# Patient Record
Sex: Female | Born: 1970 | Race: Black or African American | Hispanic: No | Marital: Married | Smoking: Never smoker
Health system: Southern US, Community
[De-identification: ages and names within clinical notes are randomized; demographics above are authoritative.]

## PROBLEM LIST (undated history)

## (undated) DIAGNOSIS — E119 Type 2 diabetes mellitus without complications: Secondary | ICD-10-CM

## (undated) DIAGNOSIS — E7800 Pure hypercholesterolemia, unspecified: Secondary | ICD-10-CM

## (undated) DIAGNOSIS — I10 Essential (primary) hypertension: Secondary | ICD-10-CM

## (undated) HISTORY — PX: BUNIONECTOMY: SHX129

## (undated) HISTORY — PX: THYROIDECTOMY: SHX17

## (undated) HISTORY — PX: TUBAL LIGATION: SHX77

---

## 2000-03-15 ENCOUNTER — Other Ambulatory Visit: Admission: RE | Admit: 2000-03-15 | Discharge: 2000-03-15 | Payer: Self-pay | Admitting: Obstetrics and Gynecology

## 2000-05-07 ENCOUNTER — Encounter: Admission: RE | Admit: 2000-05-07 | Discharge: 2000-08-05 | Payer: Self-pay | Admitting: Obstetrics and Gynecology

## 2000-07-30 ENCOUNTER — Ambulatory Visit (HOSPITAL_COMMUNITY): Admission: RE | Admit: 2000-07-30 | Discharge: 2000-07-30 | Payer: Self-pay | Admitting: Obstetrics and Gynecology

## 2000-07-30 ENCOUNTER — Encounter: Payer: Self-pay | Admitting: Obstetrics and Gynecology

## 2000-08-10 ENCOUNTER — Inpatient Hospital Stay (HOSPITAL_COMMUNITY): Admission: AD | Admit: 2000-08-10 | Discharge: 2000-08-12 | Payer: Self-pay | Admitting: Obstetrics and Gynecology

## 2001-08-02 ENCOUNTER — Other Ambulatory Visit: Admission: RE | Admit: 2001-08-02 | Discharge: 2001-08-02 | Payer: Self-pay | Admitting: Obstetrics and Gynecology

## 2004-01-03 ENCOUNTER — Other Ambulatory Visit: Admission: RE | Admit: 2004-01-03 | Discharge: 2004-01-03 | Payer: Self-pay | Admitting: Obstetrics and Gynecology

## 2005-01-20 ENCOUNTER — Other Ambulatory Visit: Admission: RE | Admit: 2005-01-20 | Discharge: 2005-01-20 | Payer: Self-pay | Admitting: Obstetrics and Gynecology

## 2012-02-02 ENCOUNTER — Ambulatory Visit: Payer: Self-pay

## 2012-06-28 ENCOUNTER — Other Ambulatory Visit: Payer: Self-pay | Admitting: Obstetrics and Gynecology

## 2012-06-28 DIAGNOSIS — R928 Other abnormal and inconclusive findings on diagnostic imaging of breast: Secondary | ICD-10-CM

## 2012-07-08 ENCOUNTER — Ambulatory Visit
Admission: RE | Admit: 2012-07-08 | Discharge: 2012-07-08 | Disposition: A | Discharge status: Home / Self Care | Payer: BC Managed Care – PPO | Source: Ambulatory Visit | Attending: Obstetrics and Gynecology | Admitting: Obstetrics and Gynecology

## 2012-07-08 ENCOUNTER — Other Ambulatory Visit: Payer: Self-pay | Admitting: Obstetrics and Gynecology

## 2012-07-08 DIAGNOSIS — R928 Other abnormal and inconclusive findings on diagnostic imaging of breast: Secondary | ICD-10-CM

## 2012-07-08 DIAGNOSIS — R921 Mammographic calcification found on diagnostic imaging of breast: Secondary | ICD-10-CM

## 2012-07-12 ENCOUNTER — Other Ambulatory Visit (HOSPITAL_COMMUNITY): Payer: Self-pay | Admitting: Diagnostic Radiology

## 2012-07-12 ENCOUNTER — Ambulatory Visit
Admission: RE | Admit: 2012-07-12 | Discharge: 2012-07-12 | Disposition: A | Discharge status: Home / Self Care | Payer: BC Managed Care – PPO | Source: Ambulatory Visit | Attending: Obstetrics and Gynecology | Admitting: Obstetrics and Gynecology

## 2012-07-12 DIAGNOSIS — R921 Mammographic calcification found on diagnostic imaging of breast: Secondary | ICD-10-CM

## 2013-07-06 ENCOUNTER — Other Ambulatory Visit: Payer: Self-pay | Admitting: Obstetrics and Gynecology

## 2015-04-30 DIAGNOSIS — E890 Postprocedural hypothyroidism: Secondary | ICD-10-CM | POA: Insufficient documentation

## 2015-04-30 DIAGNOSIS — E1165 Type 2 diabetes mellitus with hyperglycemia: Secondary | ICD-10-CM | POA: Insufficient documentation

## 2015-04-30 DIAGNOSIS — I10 Essential (primary) hypertension: Secondary | ICD-10-CM | POA: Insufficient documentation

## 2015-04-30 DIAGNOSIS — E669 Obesity, unspecified: Secondary | ICD-10-CM | POA: Insufficient documentation

## 2015-04-30 DIAGNOSIS — E892 Postprocedural hypoparathyroidism: Secondary | ICD-10-CM | POA: Insufficient documentation

## 2015-04-30 DIAGNOSIS — E559 Vitamin D deficiency, unspecified: Secondary | ICD-10-CM | POA: Insufficient documentation

## 2017-03-25 ENCOUNTER — Encounter: Payer: Self-pay | Admitting: Podiatry

## 2017-04-27 NOTE — Progress Notes (Signed)
This encounter was created in error - please disregard.

## 2017-11-18 DIAGNOSIS — E785 Hyperlipidemia, unspecified: Secondary | ICD-10-CM | POA: Insufficient documentation

## 2018-02-24 ENCOUNTER — Other Ambulatory Visit: Payer: Self-pay | Admitting: Chiropractic Medicine

## 2018-02-24 DIAGNOSIS — M545 Low back pain, unspecified: Secondary | ICD-10-CM

## 2018-03-06 ENCOUNTER — Ambulatory Visit
Admission: RE | Admit: 2018-03-06 | Discharge: 2018-03-06 | Disposition: A | Discharge status: Home / Self Care | Payer: 59 | Source: Ambulatory Visit | Attending: Chiropractic Medicine | Admitting: Chiropractic Medicine

## 2018-03-06 DIAGNOSIS — M545 Low back pain, unspecified: Secondary | ICD-10-CM

## 2019-01-31 ENCOUNTER — Other Ambulatory Visit: Payer: Self-pay

## 2019-01-31 DIAGNOSIS — Z20822 Contact with and (suspected) exposure to covid-19: Secondary | ICD-10-CM

## 2019-02-01 LAB — NOVEL CORONAVIRUS, NAA: SARS-CoV-2, NAA: NOT DETECTED

## 2019-05-12 ENCOUNTER — Ambulatory Visit (INDEPENDENT_AMBULATORY_CARE_PROVIDER_SITE_OTHER): Payer: 59 | Admitting: Podiatry

## 2019-05-12 DIAGNOSIS — Z5329 Procedure and treatment not carried out because of patient's decision for other reasons: Secondary | ICD-10-CM

## 2019-05-12 NOTE — Progress Notes (Signed)
No show for appt. 

## 2019-08-18 ENCOUNTER — Other Ambulatory Visit: Payer: Self-pay | Admitting: Podiatry

## 2019-08-18 ENCOUNTER — Ambulatory Visit: Payer: 59 | Admitting: Podiatry

## 2019-08-18 ENCOUNTER — Other Ambulatory Visit: Payer: Self-pay

## 2019-08-18 ENCOUNTER — Ambulatory Visit (INDEPENDENT_AMBULATORY_CARE_PROVIDER_SITE_OTHER): Payer: 59

## 2019-08-18 DIAGNOSIS — M21611 Bunion of right foot: Secondary | ICD-10-CM

## 2019-08-18 DIAGNOSIS — E669 Obesity, unspecified: Secondary | ICD-10-CM

## 2019-08-18 DIAGNOSIS — M2011 Hallux valgus (acquired), right foot: Secondary | ICD-10-CM | POA: Diagnosis not present

## 2019-08-18 DIAGNOSIS — M2012 Hallux valgus (acquired), left foot: Secondary | ICD-10-CM

## 2019-08-18 DIAGNOSIS — N390 Urinary tract infection, site not specified: Secondary | ICD-10-CM | POA: Insufficient documentation

## 2019-08-18 DIAGNOSIS — N898 Other specified noninflammatory disorders of vagina: Secondary | ICD-10-CM | POA: Insufficient documentation

## 2019-08-18 DIAGNOSIS — E785 Hyperlipidemia, unspecified: Secondary | ICD-10-CM

## 2019-08-18 DIAGNOSIS — I10 Essential (primary) hypertension: Secondary | ICD-10-CM

## 2019-08-18 DIAGNOSIS — M21612 Bunion of left foot: Secondary | ICD-10-CM

## 2019-08-18 NOTE — Patient Instructions (Signed)
Pre-Operative Instructions  Congratulations, you have decided to take an important step towards improving your quality of life.  You can be assured that the doctors and staff at [facility] will be with you every step of the way.  Here are some important things you should know:  1. Plan to be at the surgery center/hospital at least 1 (one) hour prior to your scheduled time, unless otherwise directed by the surgical center/hospital staff.  You must have a responsible adult accompany you, remain during the surgery and drive you home.  Make sure you have directions to the surgical center/hospital to ensure you arrive on time. 2. If you are having surgery at [facility] or [facility], you will need a copy of your medical history and physical form from your family physician within one month prior to the date of surgery. We will give you a form for your primary physician to complete.  3. We make every effort to accommodate the date you request for surgery.  However, there are times where surgery dates or times have to be moved.  We will contact you as soon as possible if a change in schedule is required.   4. No aspirin/ibuprofen for one week before surgery.  If you are on aspirin, any non-steroidal anti-inflammatory medications (Mobic, Aleve, Ibuprofen) should not be taken seven (7) days prior to your surgery.  You make take Tylenol for pain prior to surgery.  5. Medications - If you are taking daily heart and blood pressure medications, seizure, reflux, allergy, asthma, anxiety, pain or diabetes medications, make sure you notify the surgery center/hospital before the day of surgery so they can tell you which medications you should take or avoid the day of surgery. 6. No food or drink after midnight the night before surgery unless directed otherwise by surgical center/hospital staff. 7. No alcoholic beverages 24-hours prior to surgery.  No smoking 24-hours prior or 24-hours after  surgery. 8. Wear loose pants or shorts. They should be loose enough to fit over bandages, boots, and casts. 9. Don't wear slip-on shoes. Sneakers are preferred. 10. Bring your boot with you to the surgery center/hospital.  Also bring crutches or a walker if your physician has prescribed it for you.  If you do not have this equipment, it will be provided for you after surgery. 11. If you have not been contacted by the surgery center/hospital by the day before your surgery, call to confirm the date and time of your surgery. 12. Leave-time from work may vary depending on the type of surgery you have.  Appropriate arrangements should be made prior to surgery with your employer. 13. Prescriptions will be provided immediately following surgery by your doctor.  Fill these as soon as possible after surgery and take the medication as directed. Pain medications will not be refilled on weekends and must be approved by the doctor. 14. Remove nail polish on the operative foot and avoid getting pedicures prior to surgery. 15. Wash the night before surgery.  The night before surgery wash the foot and leg well with water and the antibacterial soap provided. Be sure to pay special attention to beneath the toenails and in between the toes.  Wash for at least three (3) minutes. Rinse thoroughly with water and dry well with a towel.  Perform this wash unless told not to do so by your physician.  Enclosed: 1 Ice pack (please put in freezer the night before surgery)   1 Hibiclens skin cleaner     Pre-op instructions  If you have any questions regarding the instructions, please do not hesitate to call our office.  [place]: [address], [place], [place] [address] -- [phone]  [place]: [address] [place], [place] [address] -- [phone]  [place]: [address], , [place] [address] -- [phone]   Website: [email or web address] 

## 2019-08-27 NOTE — Progress Notes (Signed)
  Subjective:  Patient ID: Cindy Farrell, female    DOB: 10/19/1970,  MRN: [id number]  Chief Complaint  Patient presents with  . Bunions    Pt states bilateral bunions which are occasionally painful.  . Foot Problem    Right 1st digit small painless bump 2 week duration no known injuries.    49 y.o. female presents with the above complaint.   Review of Systems: Negative except as noted in the HPI. Denies N/V/F/Ch.  No past medical history on file.  Current Outpatient Medications:  .  atorvastatin (LIPITOR) 40 MG tablet, Take 40 mg by mouth daily., Disp: , Rfl:  .  calcitRIOL (ROCALTROL) 0.25 MCG capsule, , Disp: , Rfl:  .  ergocalciferol (VITAMIN D2) 1.25 MG (50000 UT) capsule, ergocalciferol (vitamin D2) 1,250 mcg (50,000 unit) capsule, Disp: , Rfl:  .  JANUMET 50-1000 MG tablet, , Disp: , Rfl:  .  levothyroxine (SYNTHROID, LEVOTHROID) 125 MCG tablet, Take 125 mcg by mouth daily., Disp: , Rfl: 3 .  lisinopril-hydrochlorothiazide (PRINZIDE,ZESTORETIC) 10-12.5 MG tablet, , Disp: , Rfl:  .  metFORMIN (GLUCOPHAGE-XR) 750 MG 24 hr tablet, metformin ER 750 mg tablet,extended release 24 hr, Disp: , Rfl:  .  XIGDUO XR 06-998 MG TB24, Take 1 tablet by mouth 2 (two) times daily., Disp: , Rfl:   Social History   Tobacco Use  Smoking Status Not on file    Allergies  Allergen Reactions  . No Known Allergies    Objective:  Physical Exam: warm, good capillary refill, no trophic changes or ulcerative lesions, normal DP and PT pulses and normal sensory exam. Left Foot: hallux valgus with POP medial eminence Right Foot: hallux valgus with POP medial eminence. Hallux IPJ palpable cyst  No images are attached to the encounter.  Radiographs: Taken and reviewed. Hallux abductovalgus deformity present bilateral without acute fracture.  Assessment:   1. Hallux valgus with bunions, left   2. Hallux valgus with bunions, right   3. Hyperlipidemia, unspecified hyperlipidemia type   4.  Obesity (BMI 30-39.9)   5. Essential hypertension    Plan:  Patient was evaluated and treated and all questions answered.  Hallux abductovalgus deformity,  -XR as above. -Patient has failed all conservative therapy and wishes to proceed with surgical intervention. All risks, benefits, and alternatives discussed with patient. No guarantees given. Consent reviewed and signed by patient. Post-op course explained at length. -Planned procedures: Correction right foot bunion, excision of ganglion cyst 1st toe -Risk factors: HTN, HLD, Obesity.  Return for after surgery.

## 2019-10-05 DIAGNOSIS — M79676 Pain in unspecified toe(s): Secondary | ICD-10-CM

## 2019-10-17 ENCOUNTER — Telehealth: Payer: Self-pay | Admitting: Podiatry

## 2019-10-17 NOTE — Telephone Encounter (Signed)
DOS: 10/18/2019  [place] Bunionectomy Right [id number]) Exc. Ganglion Toe Hallux Right ([id number])  [employer] Effective 01.01.2020 -  Deductible: $1,000 with $183.95 met and $816.05 remains. Out of Pocket: $4,500 with [phone] met and $3,831.05 remains. CoInsurance: 20%   No prior authorization is required per Leveda Anna B. Call Ref# [id number].

## 2019-10-18 ENCOUNTER — Encounter: Payer: Self-pay | Admitting: Podiatry

## 2019-10-18 ENCOUNTER — Other Ambulatory Visit: Payer: Self-pay | Admitting: Podiatry

## 2019-10-18 DIAGNOSIS — M7661 Achilles tendinitis, right leg: Secondary | ICD-10-CM

## 2019-10-18 DIAGNOSIS — M2011 Hallux valgus (acquired), right foot: Secondary | ICD-10-CM

## 2019-10-18 MED ORDER — CEPHALEXIN 500 MG PO CAPS
500.0000 mg | ORAL_CAPSULE | Freq: Two times a day (BID) | ORAL | 0 refills | Status: DC
Start: 2019-10-18 — End: 2021-11-21

## 2019-10-18 MED ORDER — ONDANSETRON HCL 4 MG PO TABS: 4.0000 mg | ORAL_TABLET | Freq: Three times a day (TID) | ORAL | 0 refills | Status: DC | PRN

## 2019-10-18 MED ORDER — OXYCODONE-ACETAMINOPHEN 10-325 MG PO TABS: 1.0000 | ORAL_TABLET | ORAL | 0 refills | Status: DC | PRN

## 2019-10-18 MED ORDER — CEPHALEXIN 500 MG PO CAPS
500.0000 mg | ORAL_CAPSULE | Freq: Two times a day (BID) | ORAL | 0 refills | Status: DC
Start: 2019-10-18 — End: 2019-10-18

## 2019-10-18 NOTE — Addendum Note (Signed)
Addended by: Ventura Sellers on: 10/18/2019 02:18 PM   Modules accepted: Orders

## 2019-10-18 NOTE — Progress Notes (Signed)
Rx sent to pharmacy for outpatient surgery. °

## 2019-10-24 ENCOUNTER — Ambulatory Visit (INDEPENDENT_AMBULATORY_CARE_PROVIDER_SITE_OTHER): Payer: Managed Care, Other (non HMO)

## 2019-10-24 ENCOUNTER — Ambulatory Visit (INDEPENDENT_AMBULATORY_CARE_PROVIDER_SITE_OTHER): Payer: Managed Care, Other (non HMO) | Admitting: Podiatry

## 2019-10-24 ENCOUNTER — Other Ambulatory Visit: Payer: Self-pay

## 2019-10-24 DIAGNOSIS — M2011 Hallux valgus (acquired), right foot: Secondary | ICD-10-CM | POA: Diagnosis not present

## 2019-10-24 DIAGNOSIS — M21611 Bunion of right foot: Secondary | ICD-10-CM

## 2019-10-24 MED ORDER — HYDROCODONE-ACETAMINOPHEN 5-325 MG PO TABS: 1.0000 | ORAL_TABLET | Freq: Four times a day (QID) | ORAL | 0 refills | Status: DC | PRN

## 2019-10-24 NOTE — Progress Notes (Signed)
  Subjective:  Patient ID: Cindy Farrell, female    DOB: 08-01-1970,  MRN: [id number]  Chief Complaint  Patient presents with  . Routine Post Op    POV#1 DOS 8.18.2021 [place] BUNIONECTOMY, EXC GANGLION TOE RT. Pt states healing well, denies fever/nausea/vomiting/chills. Pt states some irritation in the right posterior heel from bandages being too tight.    DOS: 10/18/19 Procedure: [place] Bunionectomy left, excision of ganglion  49 y.o. female presents with the above complaint. History confirmed with patient.   Objective:  Physical Exam: tenderness at the surgical site, local edema noted, calf supple, nontender and reduced HAV deformity. Incision: healing well, no significant drainage, no dehiscence, no significant erythema  No images are attached to the encounter.  Radiographs: X-ray of the right foot: consistent with postop state with intact and stable fixation reduced IM deformity.   Assessment:   1. Hallux valgus with bunions, right     Plan:  Patient was evaluated and treated and all questions answered.  Post-operative State -XR reviewed with patient -Dressing applied consisting of sterile gauze, kerlix and ACE bandage -WBAT in CAM boot -Pain medication refilled  No follow-ups on file.

## 2019-11-03 ENCOUNTER — Encounter: Payer: 59 | Admitting: Podiatry

## 2019-11-03 ENCOUNTER — Ambulatory Visit (INDEPENDENT_AMBULATORY_CARE_PROVIDER_SITE_OTHER): Payer: Managed Care, Other (non HMO) | Admitting: Podiatry

## 2019-11-03 ENCOUNTER — Other Ambulatory Visit: Payer: Self-pay

## 2019-11-03 DIAGNOSIS — M2011 Hallux valgus (acquired), right foot: Secondary | ICD-10-CM

## 2019-11-03 DIAGNOSIS — M21611 Bunion of right foot: Secondary | ICD-10-CM

## 2019-11-03 NOTE — Progress Notes (Signed)
  Subjective:  Patient ID: Cindy Farrell, female    DOB: 11-Feb-1971,  MRN: [id number]  Chief Complaint  Patient presents with  . Routine Post Op    POV#2 Pt states," the pain is not like it was, I can feel where the pins are when I try to stretchi it out; 5/10 ." - pt states her big toe has a weird feeling -pt states she had to re-dressed bandage twice due to itching Tx: boot, hydro, elevation and vaseline - no N/V/F/Ch     DOS: 10/18/19 Procedure: [place] Bunionectomy left, excision of ganglion  49 y.o. female presents with the above complaint. History confirmed with patient.   Objective:  Physical Exam: tenderness at the surgical site, local edema noted, calf supple, nontender and reduced HAV deformity. Incision: healing well, no significant drainage, no dehiscence, no significant erythema  Assessment:   1. Hallux valgus with bunions, right     Plan:  Patient was evaluated and treated and all questions answered.  Post-operative State -Suture ends cut. Cover with ACE bandage -Ok to start showering at this time. Advised they cannot soak. -WBAT in CAM boot -Educated on ROM exercises. -F/u in 2 weeks for repeat XRs  No follow-ups on file.

## 2019-11-17 ENCOUNTER — Ambulatory Visit (INDEPENDENT_AMBULATORY_CARE_PROVIDER_SITE_OTHER): Payer: Managed Care, Other (non HMO)

## 2019-11-17 ENCOUNTER — Ambulatory Visit (INDEPENDENT_AMBULATORY_CARE_PROVIDER_SITE_OTHER): Payer: Managed Care, Other (non HMO) | Admitting: Podiatry

## 2019-11-17 ENCOUNTER — Other Ambulatory Visit: Payer: Self-pay

## 2019-11-17 DIAGNOSIS — Z9119 Patient's noncompliance with other medical treatment and regimen: Secondary | ICD-10-CM

## 2019-11-17 DIAGNOSIS — M2011 Hallux valgus (acquired), right foot: Secondary | ICD-10-CM

## 2019-11-17 DIAGNOSIS — S92311P Displaced fracture of first metatarsal bone, right foot, subsequent encounter for fracture with malunion: Secondary | ICD-10-CM

## 2019-11-17 DIAGNOSIS — M21611 Bunion of right foot: Secondary | ICD-10-CM

## 2019-11-17 DIAGNOSIS — Z91199 Patient's noncompliance with other medical treatment and regimen due to unspecified reason: Secondary | ICD-10-CM

## 2019-11-17 NOTE — Progress Notes (Signed)
  Subjective:  Patient ID: Cindy Farrell, female    DOB: 1970-04-08,  MRN: [id number]  Chief Complaint  Patient presents with  . Routine Post Op    POV #3 DOS 10/18/19 [place] BUNIONECTOMY, EXC GNGLION TOE RT    DOS: 10/18/19 Procedure: [place] Bunionectomy left, excision of ganglion  49 y.o. female presents with the above complaint. History confirmed with patient. States she has walked around the house without her boot on and other small trips including to the mailbox. States the incision oozed recently and she was concerned.  Objective:  Physical Exam: tenderness at the surgical site, local edema noted, calf supple, nontender and reduced HAV deformity. Incision: slight area of decreased skin approximation but without active drainage. No warmth, erythema, sign of infection.  Assessment:   1. Hallux valgus with bunions, right   2. Closed displaced fracture of first metatarsal bone of right foot with malunion, subsequent encounter   3. Non-compliance with treatment     Plan:  Patient was evaluated and treated and all questions answered.  Post-operative State -XR reviewed with patient. New angulation of the metatarsal head. -Discussed with patient that her non-compliance with WB was likely the reason for both delayed wound healing and rotation of the capital fragment. Discussed the importance of restoring joint congruity for preventing arthritis. -Patient has failed all conservative therapy and wishes to proceed with surgical intervention. All risks, benefits, and alternatives discussed with patient. No guarantees given. Consent reviewed and signed by patient. -Planned procedures: Reduction and pinning of metatarsal fracture vs open reduction of fracture.   No follow-ups on file.

## 2019-11-20 ENCOUNTER — Telehealth: Payer: Self-pay

## 2019-11-20 NOTE — Telephone Encounter (Signed)
DOS 11/23/2019  OPEN TREATMENT METATARSAL FX RT - 50037 PERCUTANEOUS SKELETAL FIXATION OF MET RT - 04888   [employer] Effective 01.01.2020 -  Deductible: $1,000 with $183.95 met and $816.05 remains. Out of Pocket: $4,500 with [phone] met and $3,831.05 remains. CoInsurance: 20%  PER AUTOMATED SYSTEM NO PRECERT REQUIRED FOR THE FOLLOWING CPT CODES.  [place] CONF # [phone] - CONF # [id number]

## 2019-11-23 ENCOUNTER — Encounter: Payer: Self-pay | Admitting: Podiatry

## 2019-11-23 ENCOUNTER — Other Ambulatory Visit: Payer: Self-pay | Admitting: Podiatry

## 2019-11-23 DIAGNOSIS — S92311P Displaced fracture of first metatarsal bone, right foot, subsequent encounter for fracture with malunion: Secondary | ICD-10-CM

## 2019-11-23 MED ORDER — CEPHALEXIN 500 MG PO CAPS: 500.0000 mg | ORAL_CAPSULE | Freq: Two times a day (BID) | ORAL | 0 refills | Status: DC

## 2019-11-23 MED ORDER — OXYCODONE-ACETAMINOPHEN 10-325 MG PO TABS: 1.0000 | ORAL_TABLET | ORAL | 0 refills | Status: DC | PRN

## 2019-11-23 NOTE — Progress Notes (Signed)
Rx sent to pharmacy for outpatient surgery.  [facility]

## 2019-11-24 ENCOUNTER — Telehealth: Payer: Self-pay | Admitting: Podiatry

## 2019-11-24 NOTE — Telephone Encounter (Signed)
Pt has requested a knee scooter.

## 2019-11-28 ENCOUNTER — Other Ambulatory Visit: Payer: Self-pay

## 2019-11-28 ENCOUNTER — Ambulatory Visit (INDEPENDENT_AMBULATORY_CARE_PROVIDER_SITE_OTHER): Payer: Managed Care, Other (non HMO) | Admitting: Podiatry

## 2019-11-28 ENCOUNTER — Ambulatory Visit (INDEPENDENT_AMBULATORY_CARE_PROVIDER_SITE_OTHER): Payer: Managed Care, Other (non HMO)

## 2019-11-28 DIAGNOSIS — M2011 Hallux valgus (acquired), right foot: Secondary | ICD-10-CM

## 2019-11-28 DIAGNOSIS — M21611 Bunion of right foot: Secondary | ICD-10-CM

## 2019-11-29 NOTE — Progress Notes (Signed)
  Subjective:  Patient ID: Cindy Farrell, female    DOB: Apr 26, 1970,  MRN: [id number]  Chief Complaint  Patient presents with  . Routine Post Op    first post-op visit, denies fever, chills, N&V.     DOS: 10/18/19 Procedure: [place] Bunionectomy left, excision of ganglion  DOS: 11/23/19 Procedure: ORIF 1st metatarsal left   49 y.o. female presents with the above complaint. History confirmed with patient. States that the pain is doing ok and she is also not having much swelling. Reports full compliance with the boot since surgery.  Objective:  Physical Exam: tenderness at the surgical site, local edema noted, calf supple, nontender and reduced HAV deformity. Incision: slight area of decreased skin approximation but without active drainage. No warmth, erythema, sign of infection.  Assessment:   1. Hallux valgus with bunions, right     Plan:  Patient was evaluated and treated and all questions answered.  Post-operative State -New XR taken, bone in much improved alignment -Dressed with DSD today. -WBAT in boot. Consider switch to short boot next visit.  No follow-ups on file.

## 2019-12-01 ENCOUNTER — Encounter: Payer: Managed Care, Other (non HMO) | Admitting: Podiatry

## 2019-12-08 ENCOUNTER — Ambulatory Visit (INDEPENDENT_AMBULATORY_CARE_PROVIDER_SITE_OTHER): Payer: Managed Care, Other (non HMO) | Admitting: Podiatry

## 2019-12-08 ENCOUNTER — Other Ambulatory Visit: Payer: Self-pay

## 2019-12-08 DIAGNOSIS — M21611 Bunion of right foot: Secondary | ICD-10-CM | POA: Diagnosis not present

## 2019-12-08 DIAGNOSIS — S92311P Displaced fracture of first metatarsal bone, right foot, subsequent encounter for fracture with malunion: Secondary | ICD-10-CM

## 2019-12-08 DIAGNOSIS — M2011 Hallux valgus (acquired), right foot: Secondary | ICD-10-CM

## 2019-12-08 DIAGNOSIS — Z9889 Other specified postprocedural states: Secondary | ICD-10-CM

## 2019-12-26 ENCOUNTER — Ambulatory Visit (INDEPENDENT_AMBULATORY_CARE_PROVIDER_SITE_OTHER): Payer: Managed Care, Other (non HMO)

## 2019-12-26 ENCOUNTER — Ambulatory Visit: Payer: Managed Care, Other (non HMO)

## 2019-12-26 ENCOUNTER — Ambulatory Visit (INDEPENDENT_AMBULATORY_CARE_PROVIDER_SITE_OTHER): Payer: Managed Care, Other (non HMO) | Admitting: Podiatry

## 2019-12-26 ENCOUNTER — Other Ambulatory Visit: Payer: Self-pay

## 2019-12-26 ENCOUNTER — Telehealth: Payer: Self-pay | Admitting: Podiatry

## 2019-12-26 DIAGNOSIS — M2011 Hallux valgus (acquired), right foot: Secondary | ICD-10-CM

## 2019-12-26 DIAGNOSIS — M21611 Bunion of right foot: Secondary | ICD-10-CM

## 2019-12-26 NOTE — Telephone Encounter (Signed)
Patient is requesting handicap plaque. Please advise

## 2019-12-29 NOTE — Telephone Encounter (Signed)
Patient can come in and get 3 month pass. Please call patient

## 2019-12-31 NOTE — Progress Notes (Signed)
  Subjective:  Patient ID: Cindy Farrell, female    DOB: Sep 01, 1970,  MRN: [id number]  No chief complaint on file.   DOS: 10/18/19 Procedure: [place] Bunionectomy left, excision of ganglion  DOS: 11/23/19 Procedure: ORIF 1st metatarsal left   49 y.o. female presents with the above complaint. History confirmed with patient. Doing very well pain controlled denies new issues has been compliant with the boot  Objective:  Physical Exam: no tenderness at the surgical site, local edema noted, calf supple, nontender and reduced HAV deformity. Incision: Well-healed thin scarring  Assessment:   1. Hallux valgus with bunions, right     Plan:  Patient was evaluated and treated and all questions answered.  Post-operative State -X-rays taken and reviewed good healing of the osteotomy noted there is only slight angulation of the capital fragment. -Transition to surgical shoe. Shoe dispensed today.  Return in about 3 weeks (around 01/16/2020).

## 2019-12-31 NOTE — Progress Notes (Signed)
  Subjective:  Patient ID: Cindy Farrell, female    DOB: 11/20/1970,  MRN: [id number]  Chief Complaint  Patient presents with  . Routine Post Op    POV#2 Pt states healing well without any concerns. Pt notes some dry skin and has been using some moisturizer. Pt denies fever/nausea/vomiting/chills.    DOS: 10/18/19 Procedure: [place] Bunionectomy left, excision of ganglion  DOS: 11/23/19 Procedure: ORIF 1st metatarsal left   48 y.o. female presents with the above complaint. History confirmed with patient. States that the pain is doing ok and she is also not having much swelling. Reports full compliance with the boot since surgery.  Objective:  Physical Exam: tenderness at the surgical site, local edema noted, calf supple, nontender and reduced HAV deformity. Incision: slight area of decreased skin approximation but without active drainage. No warmth, erythema, sign of infection.  Assessment:   1. Hallux valgus with bunions, right   2. Closed displaced fracture of first metatarsal bone of right foot with malunion, subsequent encounter   3. Post-operative state     Plan:  Patient was evaluated and treated and all questions answered.  Post-operative State -Sutures removed today.  Transition to a short cam boot.  Okay to shower no soaking.  Follow-up in 2 weeks for repeat x-rays  No follow-ups on file.

## 2020-01-19 ENCOUNTER — Ambulatory Visit (INDEPENDENT_AMBULATORY_CARE_PROVIDER_SITE_OTHER): Payer: Managed Care, Other (non HMO) | Admitting: Podiatry

## 2020-01-19 ENCOUNTER — Other Ambulatory Visit: Payer: Self-pay

## 2020-01-19 ENCOUNTER — Encounter: Payer: Self-pay | Admitting: Podiatry

## 2020-01-19 ENCOUNTER — Ambulatory Visit (INDEPENDENT_AMBULATORY_CARE_PROVIDER_SITE_OTHER): Payer: Managed Care, Other (non HMO)

## 2020-01-19 DIAGNOSIS — M21611 Bunion of right foot: Secondary | ICD-10-CM

## 2020-01-19 DIAGNOSIS — M2011 Hallux valgus (acquired), right foot: Secondary | ICD-10-CM

## 2020-01-19 NOTE — Progress Notes (Signed)
  Subjective:  Patient ID: Cindy Farrell, female    DOB: 08/30/1970,  MRN: [id number]  Chief Complaint  Patient presents with  . Routine Post Op    3wk POV Pt states no new concerns. Denies fever/nausea/vomiting/chills. Pt states some lack of sensation/numbness and lack of range of motion. Pt also admits some occasional pain.    DOS: 10/18/19 Procedure: [place] Bunionectomy left, excision of ganglion  DOS: 11/23/19 Procedure: ORIF 1st metatarsal left   49 y.o. female presents with the above complaint. History confirmed with patient.   Objective:  Physical Exam: no tenderness at the surgical site, local edema noted, calf supple, nontender and reduced HAV deformity. Incision: Well-healed thin scarring  Assessment:   1. Hallux valgus with bunions, right    Plan:  Patient was evaluated and treated and all questions answered.  Post-operative State -X-rays taken and reviewed again good healing of the osteotomy with slight delayed union dorsally. There is only slight angulation of the capital fragment. -Transition to normal shoegear in 1 week. -Continue ROM exercises. -F/u in 6 weeks for recheck.  No follow-ups on file.

## 2020-03-05 ENCOUNTER — Encounter: Payer: Managed Care, Other (non HMO) | Admitting: Podiatry

## 2020-03-19 ENCOUNTER — Ambulatory Visit (INDEPENDENT_AMBULATORY_CARE_PROVIDER_SITE_OTHER): Payer: Managed Care, Other (non HMO)

## 2020-03-19 ENCOUNTER — Other Ambulatory Visit: Payer: Self-pay

## 2020-03-19 ENCOUNTER — Ambulatory Visit (INDEPENDENT_AMBULATORY_CARE_PROVIDER_SITE_OTHER): Payer: Managed Care, Other (non HMO) | Admitting: Podiatry

## 2020-03-19 ENCOUNTER — Ambulatory Visit: Payer: Managed Care, Other (non HMO)

## 2020-03-19 ENCOUNTER — Encounter: Payer: Self-pay | Admitting: Podiatry

## 2020-03-19 DIAGNOSIS — M21611 Bunion of right foot: Secondary | ICD-10-CM

## 2020-03-19 DIAGNOSIS — M2011 Hallux valgus (acquired), right foot: Secondary | ICD-10-CM

## 2020-03-19 DIAGNOSIS — Z1211 Encounter for screening for malignant neoplasm of colon: Secondary | ICD-10-CM | POA: Insufficient documentation

## 2020-03-19 DIAGNOSIS — M2560 Stiffness of unspecified joint, not elsewhere classified: Secondary | ICD-10-CM

## 2020-03-19 NOTE — Progress Notes (Signed)
  Subjective:  Patient ID: Cindy Farrell, female    DOB: 11-13-1970,  MRN: [id number]  Chief Complaint  Patient presents with  . Routine Post Op    6wk POV Pt states still experiencing lack of range of motion.    DOS: 10/18/19 Procedure: [place] Bunionectomy left, excision of ganglion  DOS: 11/23/19 Procedure: ORIF 1st metatarsal left  50 y.o. female presents with the above complaint. History confirmed with patient. She is able to wear normal shoegear without pain or issues. She does feel that the joint is a little tight. She has not been doing her ROM exercises as directed.  Objective:  Physical Exam: no tenderness at the surgical site. Some joint tightness with improvement upon manual stretch. No pain on ROM. Toe rectus without medial prominence. Incision: Well-healed thin scarring  Assessment:   1. Hallux valgus with bunions, right   2. Joint movement restrained    Plan:  Patient was evaluated and treated and all questions answered.  Post-operative State -X-rays taken and reviewed again good healing of the osteotomy with slight angulation. Full healing of the osteotomy noted. -Continue ROM exercises. The toe was manually stretched today with marked improvement in ROM. Discussed that she must be diligent in doing these exercises and the tightness will improve. I discussed that she will not need PT if she thinks she can do these on her own. She thinks she can and would like to try that before PT. -F/u PRN   No follow-ups on file.
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
## 2021-11-21 ENCOUNTER — Ambulatory Visit (HOSPITAL_COMMUNITY)
Admission: EM | Admit: 2021-11-21 | Discharge: 2021-11-21 | Disposition: A | Discharge status: Home / Self Care | Payer: PRIVATE HEALTH INSURANCE | Attending: Physician Assistant | Admitting: Physician Assistant

## 2021-11-21 ENCOUNTER — Encounter (HOSPITAL_COMMUNITY): Payer: Self-pay | Admitting: Emergency Medicine

## 2021-11-21 DIAGNOSIS — U071 COVID-19: Secondary | ICD-10-CM | POA: Insufficient documentation

## 2021-11-21 DIAGNOSIS — B349 Viral infection, unspecified: Secondary | ICD-10-CM | POA: Diagnosis present

## 2021-11-21 HISTORY — DX: Pure hypercholesterolemia, unspecified: E78.00

## 2021-11-21 HISTORY — DX: Type 2 diabetes mellitus without complications: E11.9

## 2021-11-21 HISTORY — DX: Essential (primary) hypertension: I10

## 2021-11-21 LAB — BASIC METABOLIC PANEL
Anion gap: 14 (ref 5–15)
BUN: 14 mg/dL (ref 6–20)
CO2: 26 mmol/L (ref 22–32)
Calcium: 7.7 mg/dL — ABNORMAL LOW (ref 8.9–10.3)
Chloride: 97 mmol/L — ABNORMAL LOW (ref 98–111)
Creatinine, Ser: 0.83 mg/dL (ref 0.44–1.00)
GFR, Estimated: >60 mL/min (ref >60)
Glucose, Bld: 362 mg/dL — ABNORMAL HIGH (ref 70–99)
Potassium: 4 mmol/L (ref 3.5–5.1)
Sodium: 137 mmol/L (ref 135–145)

## 2021-11-21 MED ORDER — FLUTICASONE PROPIONATE 50 MCG/ACT NA SUSP: 1.0000 | Freq: Every day | NASAL | 0 refills | Status: AC

## 2021-11-21 MED ORDER — PROMETHAZINE-DM 6.25-15 MG/5ML PO SYRP: 5.0000 mL | ORAL_SOLUTION | Freq: Four times a day (QID) | ORAL | 0 refills | Status: DC | PRN

## 2021-11-21 NOTE — ED Provider Notes (Signed)
[facility]    CSN: [id number] Arrival date & time: 11/21/21  1644      History   Chief Complaint Chief Complaint  Patient presents with   Covid Exposure    HPI Cindy Farrell is a 51 y.o. female.   Patient presents today with a 3-day history of URI symptoms including congestion, sore throat, cough.  Denies any chest pain, shortness of breath, nausea, vomiting, diarrhea.  She has not had COVID in the past.  Was exposed to [place] by a coworker and is requesting testing today.  She has been taking TheraFlu without improvement of symptoms.  She has a history of diabetes as well as hypertension.  Denies any history of cardiovascular disease, immunosuppression, malignancy, chronic liver/kidney disease.  She has had her influenza vaccine.  Has not had recent COVID booster but has had several boosters in the past.    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      Home Medications    Prior to Admission medications   Medication Sig Start Date End Date Taking? Authorizing Provider  fluticasone (FLONASE) 50 MCG/ACT nasal spray Place 1 spray into both nostrils daily. 11/21/21  Yes Jeannett Dekoning K, PA-C  promethazine-dextromethorphan  (PROMETHAZINE-DM) 6.25-15 MG/5ML syrup Take 5 mLs by mouth 4 (four) times daily as needed for cough. 11/21/21  Yes Boysie Bonebrake K, PA-C  atorvastatin (LIPITOR) 40 MG tablet Take 40 mg by mouth daily. 06/19/19   [provider]  calcitRIOL (ROCALTROL) 0.25 MCG capsule  03/24/17   [provider]  ergocalciferol (VITAMIN D2) 1.25 MG (50000 UT) capsule ergocalciferol (vitamin D2) 1,250 mcg (50,000 unit) capsule    [provider]  levothyroxine (SYNTHROID) 112 MCG tablet Take 112 mcg by mouth daily. 02/01/20   [provider]  levothyroxine (SYNTHROID, LEVOTHROID) 125 MCG tablet Take 125 mcg by mouth daily. 02/05/17   [provider]  lisinopril-hydrochlorothiazide (PRINZIDE,ZESTORETIC) 10-12.5 MG tablet  03/18/17   [provider]  Na Sulfate-K Sulfate-Mg Sulf (SUPREP BOWEL PREP KIT) 17.5-3.13-1.6 GM/177ML SOLN See admin instructions. 01/24/19   [provider]  XIGDUO XR 06-998 MG TB24 Take 1 tablet by mouth 2 (two) times daily. 08/01/19   [provider]    Family History History reviewed. No pertinent family history.  Social History     Allergies   No known allergies   Review of Systems Review of Systems  Constitutional:  Positive for activity change. Negative for appetite change, fatigue and fever.  HENT:  Positive for congestion. Negative for sinus pressure, sneezing and sore throat.   Respiratory:  Positive for cough. Negative for shortness of breath.   Cardiovascular:  Negative for chest pain.  Gastrointestinal:  Negative for abdominal pain,  diarrhea, nausea and vomiting.  Neurological:  Negative for dizziness, light-headedness and headaches.     Physical Exam Triage Vital Signs ED Triage Vitals  Enc Vitals Group     BP 11/21/21 1702 (!) 154/102     Pulse Rate 11/21/21 1702 82     Resp 11/21/21 1702 18     Temp 11/21/21 1702 98.5 F (36.9 C)     Temp Source 11/21/21 1702 Oral     SpO2 11/21/21 1702 98 %      Weight --      Height --      Head Circumference --      Peak Flow --      Pain Score 11/21/21 1658 2     Pain Loc --      Pain Edu? --      Excl. in [place]? --    No data found.  Updated Vital Signs BP (!) 154/102 (BP Location: Left Arm)   Pulse 82   Temp 98.5 F (36.9 C) (Oral)   Resp 18   LMP 06/26/2012   SpO2 98%   Visual Acuity Right Eye Distance:   Left Eye Distance:   Bilateral Distance:    Right Eye Near:   Left Eye Near:    Bilateral Near:     Physical Exam Vitals reviewed.  Constitutional:      General: She is awake. She is not in acute distress.    Appearance: Normal appearance. She is well-developed. She is not ill-appearing.     Comments: Very pleasant female presented age no acute distress  HENT:     Head: Normocephalic and atraumatic.     Right Ear: Tympanic membrane, ear canal and external ear normal. Tympanic membrane is not erythematous or bulging.     Left Ear: Tympanic membrane, ear canal and external ear normal. Tympanic membrane is not erythematous or bulging.     Nose:     Right Sinus: No maxillary sinus tenderness.     Left Sinus: No maxillary sinus tenderness.     Mouth/Throat:     Pharynx: Uvula midline. Posterior oropharyngeal erythema present. No oropharyngeal exudate.  Cardiovascular:     Rate and Rhythm: Normal rate and regular rhythm.     Heart sounds: Normal heart sounds, S1 normal and S2 normal. No murmur heard. Pulmonary:     Effort: Pulmonary effort is normal.     Breath sounds: Normal breath sounds. No wheezing, rhonchi or rales.     Comments: Clear to auscultation bilaterally Psychiatric:        Behavior: Behavior is cooperative.      UC Treatments / Results  Labs (all labs ordered are listed, but only abnormal results are displayed) Labs Reviewed  SARS CORONAVIRUS 2 (TAT 6-24 HRS)  BASIC METABOLIC PANEL    EKG   Radiology No results found.  Procedures Procedures (including critical care time)  Medications  Ordered in UC Medications - No data to display  Initial Impression / Assessment and Plan / UC Course  I have reviewed the triage vital signs and the nursing notes.  Pertinent labs & imaging results that were available during my care of the patient were reviewed by me and considered in my medical decision making (see chart for details).     Patient is well-appearing, afebrile, nontoxic, nontachycardic.  Concern for viral etiology specifically COVID given known exposure.  COVID testing was obtained today-results pending.  Patient would benefit from Paxlovid if positive since she is over the  age of 70 and has a history of hypertension and diabetes.  She has not had a recent GFR so we will obtain a basic metabolic panel in case she is positive and would like to start this medication.  She would have to hold her atorvastatin while on the medication for 3 days after completing course but does not require any additional medication adjustments.  She is to use over-the-counter medication including Tylenol and ibuprofen.  She was prescribed Promethazine DM for cough as well as Flonase for congestion.  Discussed that Promethazine DM can be sedating and she should not drive or drink alcohol while taking it.  She is to rest and drink plenty of fluid.  She was provided work excuse note with current CDC return to work guidelines.  Discussed that if anything worsens she needs to be seen immediately to which she expressed understanding.  Final Clinical Impressions(s) / UC Diagnoses   Final diagnoses:  Viral illness     Discharge Instructions      I am concerned you have COVID.  Please monitor your MyChart for results.  If you are positive I think it would be reasonable to start Paxlovid.  If you end up taking this medication you need to hold your atorvastatin while on the medication for 3 days after completing the course.  Use Promethazine DM for cough.  This will make you sleepy so do not drive or drink  alcohol with taking it.  Use Flonase for congestion.  Alternate Tylenol ibuprofen for pain.  Make sure you rest and drink plenty of fluid.  If you have any worsening symptoms or if anything changes you need to be seen immediately.     ED Prescriptions     Medication Sig Dispense Auth. Provider   fluticasone (FLONASE) 50 MCG/ACT nasal spray Place 1 spray into both nostrils daily. 16 g Arissa Fagin K, PA-C   promethazine-dextromethorphan (PROMETHAZINE-DM) 6.25-15 MG/5ML syrup Take 5 mLs by mouth 4 (four) times daily as needed for cough. 118 mL Chesnie Capell K, PA-C      PDMP not reviewed this encounter.   Terrilee Croak, PA-C 11/21/21 1754

## 2021-11-21 NOTE — Discharge Instructions (Signed)
I am concerned you have COVID.  Please monitor your MyChart for results.  If you are positive I think it would be reasonable to start Paxlovid.  If you end up taking this medication you need to hold your atorvastatin while on the medication for 3 days after completing the course.  Use Promethazine DM for cough.  This will make you sleepy so do not drive or drink alcohol with taking it.  Use Flonase for congestion.  Alternate Tylenol ibuprofen for pain.  Make sure you rest and drink plenty of fluid.  If you have any worsening symptoms or if anything changes you need to be seen immediately.

## 2021-11-21 NOTE — ED Triage Notes (Signed)
Since Monday having congestion and sore throat as well as cough, taking Theraflu.  Coworker tested covid+ today.

## 2021-11-22 ENCOUNTER — Telehealth (HOSPITAL_COMMUNITY): Payer: Self-pay | Admitting: Physician Assistant

## 2021-11-22 LAB — SARS CORONAVIRUS 2 (TAT 6-24 HRS): SARS Coronavirus 2: POSITIVE — AB

## 2021-11-22 MED ORDER — NIRMATRELVIR/RITONAVIR (PAXLOVID)TABLET: 3.0000 | ORAL_TABLET | Freq: Two times a day (BID) | ORAL | 0 refills | Status: AC

## 2021-11-22 NOTE — Telephone Encounter (Signed)
Patient is positive for COVID.  Attempted to call her to discuss results.  Unable to reach patient we will try again later today.  We discussed at her visit that she is a candidate for Paxlovid.  This was sent to the pharmacy.  MyChart message was sent as well.  See result note for additional information.

## 2022-06-19 ENCOUNTER — Encounter (HOSPITAL_COMMUNITY): Payer: Self-pay

## 2022-06-19 ENCOUNTER — Ambulatory Visit (HOSPITAL_COMMUNITY)
Admission: EM | Admit: 2022-06-19 | Discharge: 2022-06-19 | Disposition: A | Discharge status: Home / Self Care | Payer: PRIVATE HEALTH INSURANCE | Attending: Emergency Medicine | Admitting: Emergency Medicine

## 2022-06-19 DIAGNOSIS — J029 Acute pharyngitis, unspecified: Secondary | ICD-10-CM | POA: Diagnosis not present

## 2022-06-19 DIAGNOSIS — J302 Other seasonal allergic rhinitis: Secondary | ICD-10-CM | POA: Diagnosis present

## 2022-06-19 LAB — POCT RAPID STREP A (OFFICE): Rapid Strep A Screen: NEGATIVE

## 2022-06-19 MED ORDER — CETIRIZINE HCL 10 MG PO TABS: 10.0000 mg | ORAL_TABLET | Freq: Every day | ORAL | 2 refills | Status: DC

## 2022-06-19 NOTE — ED Triage Notes (Signed)
Pt presents to the office for sore throat and nasal congestion x 3 days.

## 2022-06-19 NOTE — ED Provider Notes (Signed)
[facility]    CSN: [id number] Arrival date & time: 06/19/22  1748      History   Chief Complaint Chief Complaint  Patient presents with   Sore Throat    HPI Cindy Farrell is a 52 y.o. female.  3 day history of sore throat, discomfort with swallowing. Feels scratchy. Had an episode of itchy watery eyes when she was outside. A little nasal congestion that resolved  No fever or chills. No cough. 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      Home Medications    Prior to Admission medications   Medication Sig Start Date End Date Taking? Authorizing Provider  atorvastatin (LIPITOR) 40 MG tablet Take 40 mg by mouth daily. 06/19/19  Yes [provider]  calcitRIOL (ROCALTROL) 0.25 MCG capsule  03/24/17  Yes [provider]  cetirizine (ZYRTEC ALLERGY) 10 MG tablet Take 1 tablet (10 mg total) by mouth daily. 06/19/22  Yes Congetta Odriscoll, Lurena Joiner, PA-C  ergocalciferol (VITAMIN D2) 1.25 MG (50000 UT) capsule ergocalciferol (vitamin D2) 1,250 mcg (50,000 unit) capsule   Yes [provider]  levothyroxine (SYNTHROID, LEVOTHROID) 125 MCG tablet Take 125 mcg by mouth daily. 02/05/17  Yes  [provider]  lisinopril-hydrochlorothiazide (PRINZIDE,ZESTORETIC) 10-12.5 MG tablet  03/18/17  Yes [provider]  XIGDUO XR 06-998 MG TB24 Take 1 tablet by mouth 2 (two) times daily. 08/01/19  Yes [provider]  fluticasone (FLONASE) 50 MCG/ACT nasal spray Place 1 spray into both nostrils daily. 11/21/21   Raspet, Noberto Retort, PA-C  levothyroxine (SYNTHROID) 112 MCG tablet Take 112 mcg by mouth daily. 02/01/20   [provider]  Na Sulfate-K Sulfate-Mg Sulf (SUPREP BOWEL PREP KIT) 17.5-3.13-1.6 GM/177ML SOLN See admin instructions. 01/24/19   [provider]  promethazine-dextromethorphan (PROMETHAZINE-DM) 6.25-15 MG/5ML syrup Take 5 mLs by mouth 4 (four) times daily as needed for cough. 11/21/21   Raspet, Noberto Retort, PA-C    Family History History reviewed. No pertinent family history.  Social History Social History   Tobacco Use   Smoking status: Never   Smokeless tobacco: Never     Allergies   No known allergies   Review of Systems Review of Systems As per HPI  Physical Exam Triage Vital Signs ED Triage Vitals [06/19/22 1759]  Enc Vitals Group     BP 112/81     Pulse Rate 77     Resp 18     Temp 97.8 F (36.6 C)     Temp Source Oral     SpO2 95 %     Weight      Height      Head Circumference      Peak  Flow      Pain Score      Pain Loc      Pain Edu?      Excl. in [place]?    No data found.  Updated Vital Signs BP 112/81 (BP Location: Left Arm)   Pulse 77   Temp 97.8 F (36.6 C) (Oral)   Resp 18   LMP 06/26/2012   SpO2 95%     Physical Exam Vitals and nursing note reviewed.  Constitutional:      General: She is not in acute distress. HENT:     Right Ear: Tympanic membrane and ear canal normal.     Left Ear: Tympanic membrane and ear canal normal.     Nose: No congestion or rhinorrhea.     Mouth/Throat:     Mouth: Mucous membranes are moist.     Pharynx: Oropharynx is clear. Posterior oropharyngeal erythema  present. No oropharyngeal exudate.  Eyes:     Conjunctiva/sclera: Conjunctivae normal.  Cardiovascular:     Rate and Rhythm: Normal rate and regular rhythm.     Pulses: Normal pulses.     Heart sounds: Normal heart sounds.  Pulmonary:     Effort: Pulmonary effort is normal.     Breath sounds: Normal breath sounds.  Musculoskeletal:     Cervical back: Normal range of motion.  Lymphadenopathy:     Cervical: No cervical adenopathy.  Skin:    General: Skin is warm and dry.  Neurological:     Mental Status: She is alert and oriented to person, place, and time.    UC Treatments / Results  Labs (all labs ordered are listed, but only abnormal results are displayed) Labs Reviewed  CULTURE, GROUP A STREP [facility])  POCT RAPID STREP A (OFFICE)    EKG   Radiology No results found.  Procedures Procedures (including critical care time)  Medications Ordered in UC Medications - No data to display  Initial Impression / Assessment and Plan / UC Course  I have reviewed the triage vital signs and the nursing notes.  Pertinent labs & imaging results that were available during my care of the patient were reviewed by me and considered in my medical decision making (see chart for details).  Afebrile, well-appearing. Strep test negative, culture is pending. Suspect combination of seasonal allergies with viral pharyngitis.  Recommend starting once daily allergy medicine, can use tylenol for pain control, other symptomatic care at home. Can return if needed. Patient agreeable to plan  Final Clinical Impressions(s) / UC Diagnoses   Final diagnoses:  Seasonal allergies  Viral pharyngitis     Discharge Instructions      I recommend to start once daily allergy medicine such as cetirizine (Zyrtec) or fexofenadine (Allegra)  You can use tylenol 500 mg every 4-6 hours for pain Try salt water gargles, honey, throat lozenges, and lots of fluids!     ED Prescriptions     Medication Sig  Dispense Auth. Provider   cetirizine (ZYRTEC ALLERGY) 10 MG tablet Take 1 tablet (10 mg total) by mouth daily. 30 tablet Deontrey Massi, Lurena Joiner, PA-C      PDMP not reviewed this encounter.   Marlow Baars, [place] 06/19/22 1942

## 2022-06-19 NOTE — Discharge Instructions (Addendum)
I recommend to start once daily allergy medicine such as cetirizine (Zyrtec) or fexofenadine (Allegra)  You can use tylenol 500 mg every 4-6 hours for pain Try salt water gargles, honey, throat lozenges, and lots of fluids!

## 2022-06-21 LAB — CULTURE, GROUP A STREP (THRC)

## 2022-06-22 ENCOUNTER — Telehealth (HOSPITAL_COMMUNITY): Payer: Self-pay | Admitting: Emergency Medicine

## 2022-06-22 MED ORDER — AMOXICILLIN 500 MG PO CAPS: 500.0000 mg | ORAL_CAPSULE | Freq: Two times a day (BID) | ORAL | 0 refills | Status: AC

## 2023-06-21 ENCOUNTER — Ambulatory Visit (HOSPITAL_COMMUNITY): Admission: EM | Admit: 2023-06-21 | Discharge: 2023-06-21 | Disposition: A | Discharge status: Home / Self Care

## 2023-06-21 ENCOUNTER — Ambulatory Visit (INDEPENDENT_AMBULATORY_CARE_PROVIDER_SITE_OTHER)

## 2023-06-21 ENCOUNTER — Encounter (HOSPITAL_COMMUNITY): Payer: Self-pay

## 2023-06-21 DIAGNOSIS — R053 Chronic cough: Secondary | ICD-10-CM | POA: Diagnosis not present

## 2023-06-21 DIAGNOSIS — J069 Acute upper respiratory infection, unspecified: Secondary | ICD-10-CM

## 2023-06-21 MED ORDER — FEXOFENADINE HCL 180 MG PO TABS
180.0000 mg | ORAL_TABLET | Freq: Every day | ORAL | 1 refills | Status: AC
Start: 2023-06-21 — End: ?

## 2023-06-21 MED ORDER — AZELASTINE HCL 0.1 % NA SOLN: 1.0000 | Freq: Two times a day (BID) | NASAL | 1 refills | Status: AC

## 2023-06-21 NOTE — ED Triage Notes (Signed)
 Patient here today with c/o cough, ST, hoarseness, and nasal congestion X 2 - 2.5 weeks. She has taken Claritin with no relief. One of her coworkers has been clearing his throat. Patient states that she is also concerned with some mold in her workplace and not sure if her symptoms are coming from that.

## 2023-06-21 NOTE — Discharge Instructions (Addendum)
 1.Viral URI with cough - DG Chest 2 View x-ray performed in UC shows no acute cardiopulmonary processes, no sign of consolidation or pneumonia. - azelastine  (ASTELIN ) 0.1 % nasal spray; Place 1 spray into both nostrils 2 (two) times daily. Use in each nostril as directed  Dispense: 30 mL; Refill: 1 - fexofenadine  (ALLEGRA ) 180 MG tablet; Take 1 tablet (180 mg total) by mouth daily.  Dispense: 60 tablet; Refill: 1 -Continue to monitor symptoms for any change in severity if there is any escalation of current symptoms or development of new symptoms follow-up in ER for further evaluation and management.

## 2023-06-21 NOTE — ED Provider Notes (Signed)
 [facility]  Note:  This document was prepared using Dragon voice recognition software and may include unintentional dictation errors.  MRN: [id number] DOB: 06/25/1970  Subjective:   Cindy Farrell is a 53 y.o. female presenting for cough, sore throat, laryngitis, nasal congestion x 2-1/2 weeks.  Patient took Claritin for a few days without relief.  Patient does report some possible mold exposure at her workplace.  Patient concerned that it may be causing her symptoms.  Denies any known sick contacts.  Has not taken any other over-the-counter medication to treat symptoms.  No current facility-administered medications for this encounter.  Current Outpatient Medications:    azelastine  (ASTELIN ) 0.1 % nasal spray, Place 1 spray into both nostrils 2 (two) times daily. Use in each nostril as directed, Disp: 30 mL, Rfl: 1   fexofenadine  (ALLEGRA ) 180 MG tablet, Take 1 tablet (180 mg total) by mouth daily., Disp: 60 tablet, Rfl: 1   finasteride (PROSCAR) 5 MG tablet, Take 1 tablet by mouth daily., Disp: , Rfl:    losartan (COZAAR) 100 MG tablet, Take 1 tablet by mouth daily., Disp: , Rfl:    minoxidil (LONITEN) 2.5 MG tablet, Take 2.5 mg by mouth daily., Disp: , Rfl:    MOUNJARO 5 MG/0.5ML Pen, Inject 5 mg into the skin once a week., Disp: , Rfl:    triamcinolone cream (KENALOG) 0.1 %, Apply 1 Application topically 2 (two) times daily., Disp: , Rfl:    atorvastatin (LIPITOR) 40 MG tablet, Take 40 mg by mouth daily., Disp: , Rfl:    calcitRIOL (ROCALTROL) 0.25 MCG capsule, , Disp: , Rfl:    ergocalciferol (VITAMIN D2) 1.25 MG (50000 UT) capsule, ergocalciferol (vitamin D2) 1,250 mcg (50,000 unit) capsule, Disp: , Rfl:    FARXIGA 10 MG TABS tablet, Take 10 mg by mouth daily., Disp: , Rfl:    fluticasone  (FLONASE ) 50 MCG/ACT nasal spray, Place 1 spray into both nostrils daily., Disp: 16 g, Rfl: 0   glimepiride (AMARYL) 4 MG tablet, Take 4 mg by mouth daily., Disp: , Rfl:     levothyroxine (SYNTHROID) 112 MCG tablet, Take 112 mcg by mouth daily., Disp: , Rfl:    levothyroxine (SYNTHROID, LEVOTHROID) 125 MCG tablet, Take 125 mcg by mouth daily., Disp: , Rfl: 3   Allergies  Allergen Reactions   No Known Allergies     Past Medical History:  Diagnosis Date   Diabetes mellitus without complication ([facility])    High cholesterol    Hypertension      Past Surgical History:  Procedure Laterality Date   BUNIONECTOMY     THYROIDECTOMY     TUBAL LIGATION      History reviewed. No pertinent family history.  Social History   Tobacco Use   Smoking status: Never   Smokeless tobacco: Never  Vaping Use   Vaping status: Never Used  Substance Use Topics   Alcohol use: Yes    Comment: occasionally   Drug use: Never    ROS Refer to HPI for ROS details.  Objective:   Vitals: BP (!) 149/96 (BP Location: Right Arm)   Pulse 80   Temp 98 F (36.7 C) (Oral)   Resp 16   Ht 5\' 2"  (1.575 m)   Wt 175 lb (79.4 kg)   LMP 06/26/2012   SpO2 97%   BMI 32.01 kg/m   Physical Exam Vitals and nursing note reviewed.  Constitutional:      General: She is not in acute distress.  Appearance: Normal appearance. She is well-developed. She is not ill-appearing or toxic-appearing.  HENT:     Head: Normocephalic and atraumatic.     Nose: Congestion and rhinorrhea present.     Mouth/Throat:     Mouth: Mucous membranes are moist.     Pharynx: Oropharynx is clear. No oropharyngeal exudate or posterior oropharyngeal erythema.  Eyes:     Extraocular Movements: Extraocular movements intact.     Conjunctiva/sclera: Conjunctivae normal.  Cardiovascular:     Rate and Rhythm: Normal rate and regular rhythm.     Heart sounds: No murmur heard. Pulmonary:     Effort: Pulmonary effort is normal. No respiratory distress.     Breath sounds: Normal breath sounds. No stridor. No wheezing, rhonchi or rales.  Musculoskeletal:        General: Normal range of motion.  Skin:     General: Skin is warm and dry.  Neurological:     General: No focal deficit present.     Mental Status: She is alert and oriented to person, place, and time.  Psychiatric:        Mood and Affect: Mood normal.        Behavior: Behavior normal.     Procedures  No results found for this or any previous visit (from the past 24 hours).  No results found.   Assessment and Plan :     Discharge Instructions      1.Viral URI with cough - DG Chest 2 View x-ray performed in UC shows no acute cardiopulmonary processes, no sign of consolidation or pneumonia. - azelastine  (ASTELIN ) 0.1 % nasal spray; Place 1 spray into both nostrils 2 (two) times daily. Use in each nostril as directed  Dispense: 30 mL; Refill: 1 - fexofenadine  (ALLEGRA ) 180 MG tablet; Take 1 tablet (180 mg total) by mouth daily.  Dispense: 60 tablet; Refill: 1 -Continue to monitor symptoms for any change in severity if there is any escalation of current symptoms or development of new symptoms follow-up in ER for further evaluation and management.        Amiri Riechers B Atlas Kuc   Anitha Kreiser, [place], [place] 06/21/23 2001

## 2023-06-22 ENCOUNTER — Ambulatory Visit (HOSPITAL_COMMUNITY): Payer: Self-pay

## 2023-06-22 ENCOUNTER — Ambulatory Visit: Payer: Self-pay

## 2023-06-25 ENCOUNTER — Encounter (HOSPITAL_COMMUNITY): Payer: Self-pay
